# Patient Record
Sex: Male | Born: 1985 | Race: Black or African American | Hispanic: No | Marital: Single | Smoking: Current every day smoker
Health system: Southern US, Community
[De-identification: ages and names within clinical notes are randomized; demographics above are authoritative.]

---

## 2019-06-19 ENCOUNTER — Emergency Department (HOSPITAL_COMMUNITY): Payer: Self-pay

## 2019-06-19 ENCOUNTER — Other Ambulatory Visit: Payer: Self-pay

## 2019-06-19 ENCOUNTER — Encounter (HOSPITAL_COMMUNITY): Payer: Self-pay | Admitting: Emergency Medicine

## 2019-06-19 ENCOUNTER — Emergency Department (HOSPITAL_COMMUNITY)
Admission: EM | Admit: 2019-06-19 | Discharge: 2019-06-19 | Disposition: A | Discharge status: Home / Self Care | Payer: Self-pay | Attending: Emergency Medicine | Admitting: Emergency Medicine

## 2019-06-19 DIAGNOSIS — Y939 Activity, unspecified: Secondary | ICD-10-CM | POA: Insufficient documentation

## 2019-06-19 DIAGNOSIS — X58XXXA Exposure to other specified factors, initial encounter: Secondary | ICD-10-CM | POA: Insufficient documentation

## 2019-06-19 DIAGNOSIS — S838X1A Sprain of other specified parts of right knee, initial encounter: Secondary | ICD-10-CM | POA: Insufficient documentation

## 2019-06-19 DIAGNOSIS — F17210 Nicotine dependence, cigarettes, uncomplicated: Secondary | ICD-10-CM | POA: Insufficient documentation

## 2019-06-19 DIAGNOSIS — Y999 Unspecified external cause status: Secondary | ICD-10-CM | POA: Insufficient documentation

## 2019-06-19 DIAGNOSIS — Y929 Unspecified place or not applicable: Secondary | ICD-10-CM | POA: Insufficient documentation

## 2019-06-19 MED ORDER — DICLOFENAC SODIUM 75 MG PO TBEC: 75.0000 mg | DELAYED_RELEASE_TABLET | Freq: Two times a day (BID) | ORAL | 0 refills | Status: DC

## 2019-06-19 NOTE — ED Notes (Signed)
All appropriate discharge materials reviewed at length with patient. Time for questions provided. Pt has no other questions at this time and verbalizes understanding of all provided materials.  

## 2019-06-19 NOTE — ED Provider Notes (Addendum)
[facility] EMERGENCY DEPARTMENT Provider Note   CSN: [id number] Arrival date & time: 06/19/19  1028     History Chief Complaint  Patient presents with  . Knee Pain    Lenford Cratty is a 34 y.o. male.  The history is provided by the patient. No language interpreter was used.  Knee Pain Location:  Knee Time since incident:  1 day Injury: no   Knee location:  R knee Pain details:    Quality:  Aching   Radiates to:  Does not radiate   Severity:  Moderate   Onset quality:  Gradual   Duration:  1 day   Timing:  Constant Chronicity:  New Foreign body present:  No foreign bodies Prior injury to area:  Yes Relieved by:  Nothing Worsened by:  Nothing Risk factors: no frequent fractures        History reviewed. No pertinent past medical history.  There are no problems to display for this patient.   History reviewed. No pertinent surgical history.     No family history on file.  Social History   Tobacco Use  . Smoking status: Current Every Day Smoker  . Smokeless tobacco: Never Used  Substance Use Topics  . Alcohol use: Not Currently  . Drug use: Not Currently    Home Medications Prior to Admission medications   Not on File    Allergies    Patient has no allergy information on record.  Review of Systems   Review of Systems  Musculoskeletal: Positive for joint swelling and myalgias.  All other systems reviewed and are negative.   Physical Exam Updated Vital Signs BP 115/70   Pulse 75   Temp 98.5 F (36.9 C) (Oral)   Resp 18   Ht 5\' 8"  (1.727 m)   Wt 102.1 kg   SpO2 98%   BMI 34.21 kg/m   Physical Exam Vitals reviewed.  Constitutional:      Appearance: Normal appearance.  HENT:     Head: Normocephalic.  Cardiovascular:     Rate and Rhythm: Normal rate.  Pulmonary:     Effort: Pulmonary effort is normal.  Abdominal:     General: Abdomen is flat.  Musculoskeletal:        General: Swelling and tenderness present.      Comments: Tender medial right knee, pain with movement, nv nad ns intact   Skin:    General: Skin is warm.  Neurological:     General: No focal deficit present.     Mental Status: He is alert.  Psychiatric:        Mood and Affect: Mood normal.    Pt injured his ACL several years ago and did not have repaired  ED Results / Procedures / Treatments   Labs (all labs ordered are listed, but only abnormal results are displayed) Labs Reviewed - No data to display  EKG None  Radiology DG Knee Complete 4 Views Right  Result Date: 06/19/2019 CLINICAL DATA:  Pt with past injuries to right knee (meniscus and ACL tear) felt a pop today when bending down at work - since the pop pt is having severe right knee pain especially just medially and laterally to the patella EXAM: RIGHT KNEE - COMPLETE 4+ VIEW COMPARISON:  None. FINDINGS: No evidence of fracture, dislocation, or joint effusion. No evidence of arthropathy or other focal bone abnormality. Soft tissues are unremarkable. IMPRESSION: Negative. Electronically Signed   By: 06/21/2019 M.D.   On: 06/19/2019  11:15    Procedures Procedures (including critical care time)  Medications Ordered in ED Medications - No data to display  ED Course  I have reviewed the triage vital signs and the nursing notes.  Pertinent labs & imaging results that were available during my care of the patient were reviewed by me and considered in my medical decision making (see chart for details).    MDM Rules/Calculators/A&P                      MDM: xray no fracture, Pt placed in a knee imbolizer and given crutches,  Pt advised to schedule to see Dr. Erlinda Hong for evalaution  Final Clinical Impression(s) / ED Diagnoses Final diagnoses:  Sprain of other ligament of right knee, initial encounter    Rx / DC Orders ED Discharge Orders         Ordered    diclofenac (VOLTAREN) 75 MG EC tablet  2 times daily     06/19/19 1136        An After Visit Summary was  printed and given to the patient.    Fransico Meadow, PA-C 06/19/19 1339    Fransico Meadow, [place] 06/19/19 1341    Valarie Merino, MD 06/22/19 1106

## 2019-06-19 NOTE — Discharge Instructions (Signed)
Schedule to see the Orthopedist for evaluation

## 2019-06-19 NOTE — ED Triage Notes (Signed)
C/o R knee pain since bending down at work today.  Ambulatory to triage.

## 2020-10-28 IMAGING — CR DG KNEE COMPLETE 4+V*R*
4 series · 4 of 4 positions shown · non-contrast
Comparison: None.

CLINICAL DATA: Pt with past injuries to right knee (meniscus and
ACL tear) felt a pop today when bending down at work - since the pop
pt is having severe right knee pain especially just medially and
laterally to the patella

EXAM:
RIGHT KNEE - COMPLETE 4+ VIEW

[knee ap]
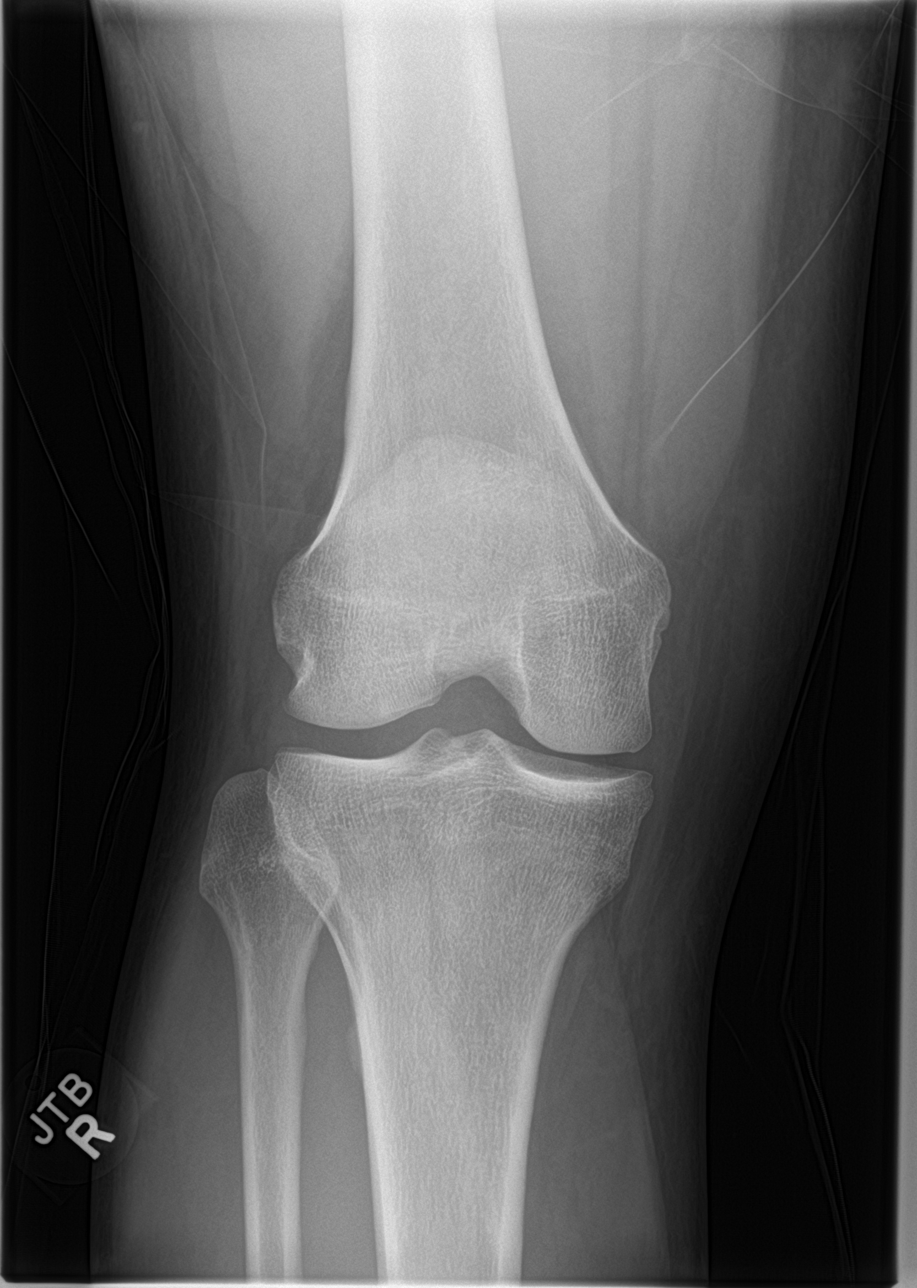

[knee lat]
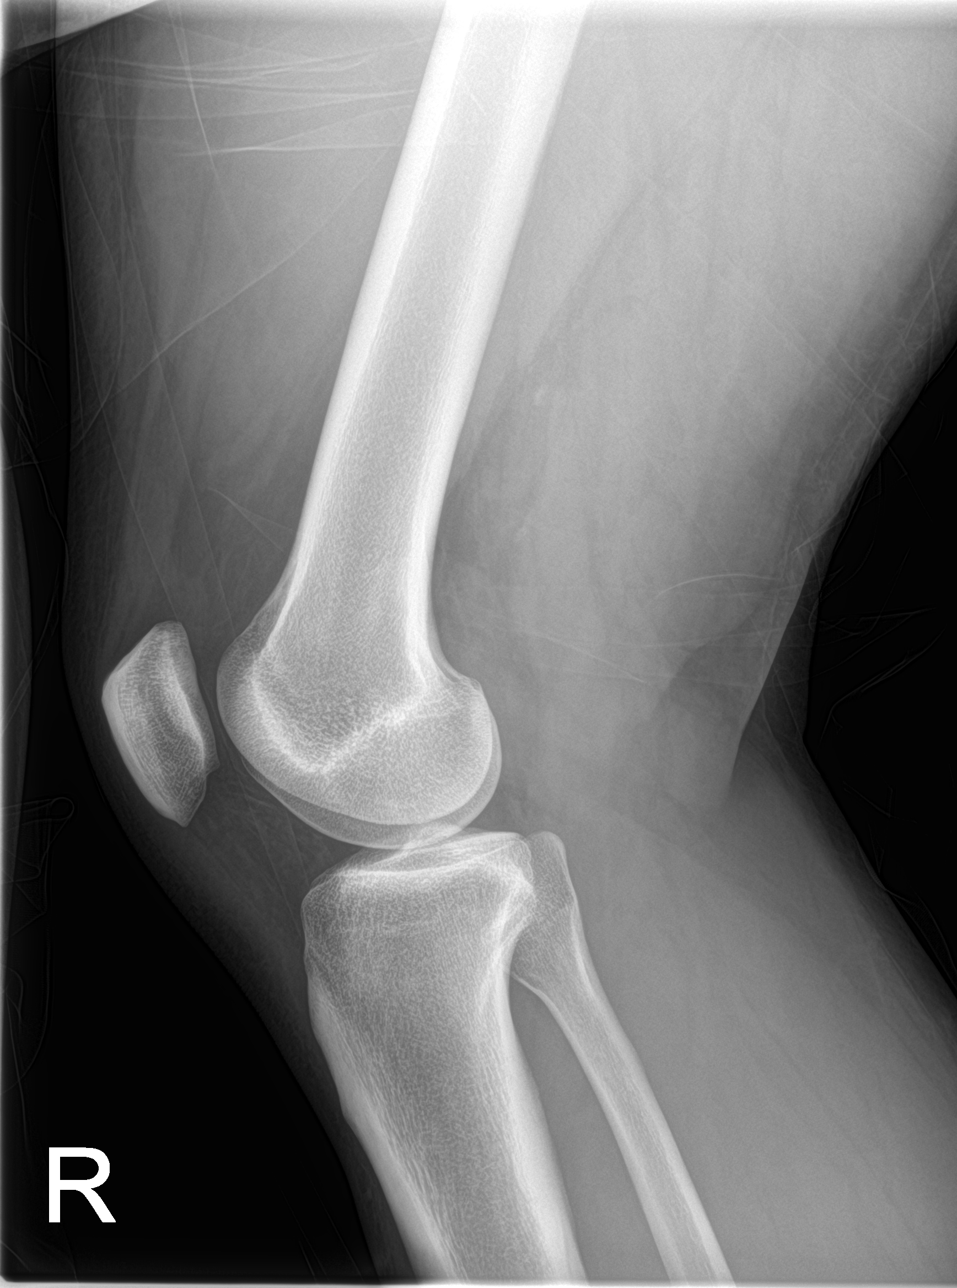

[knee obl (1 of 2)]
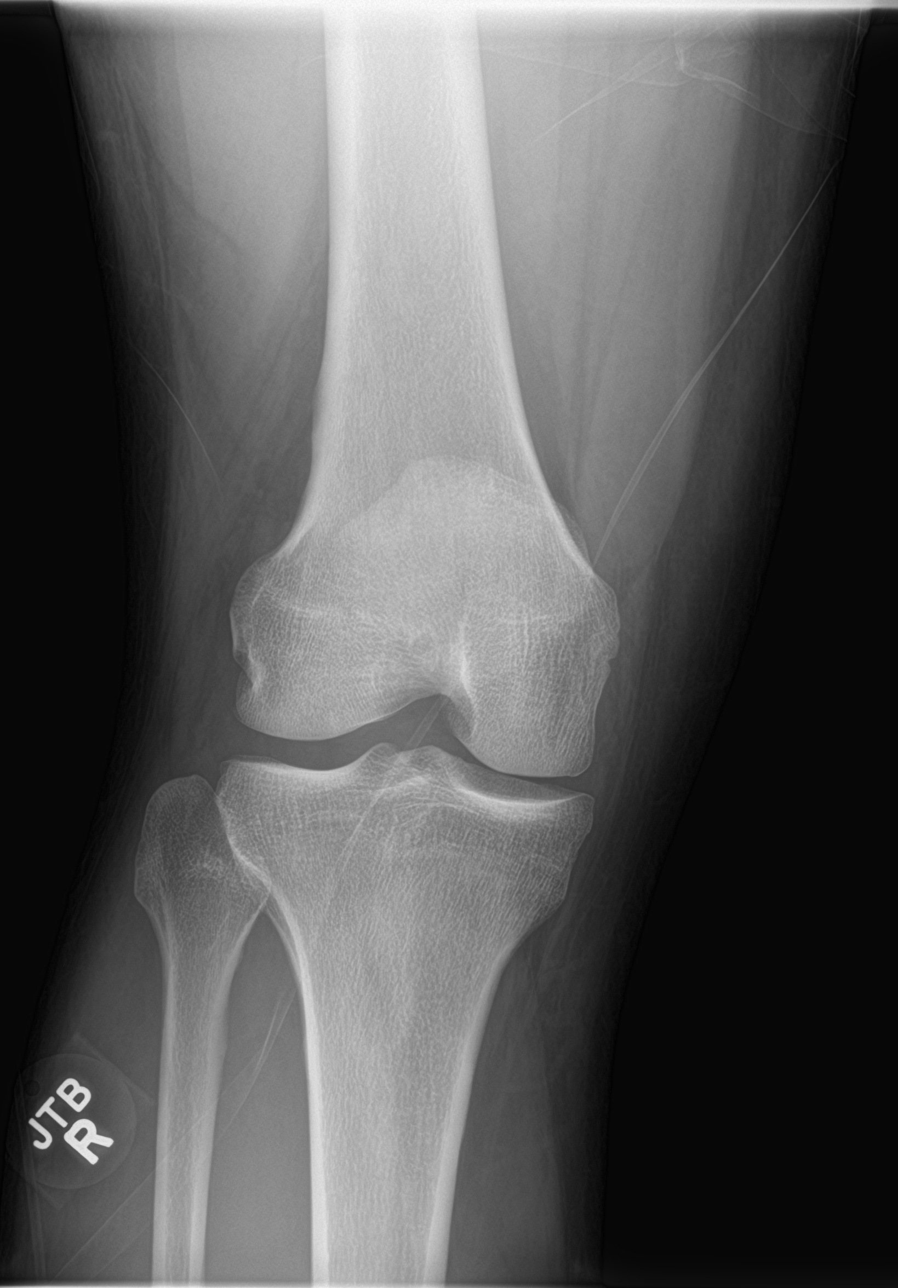

[knee obl (2 of 2)]
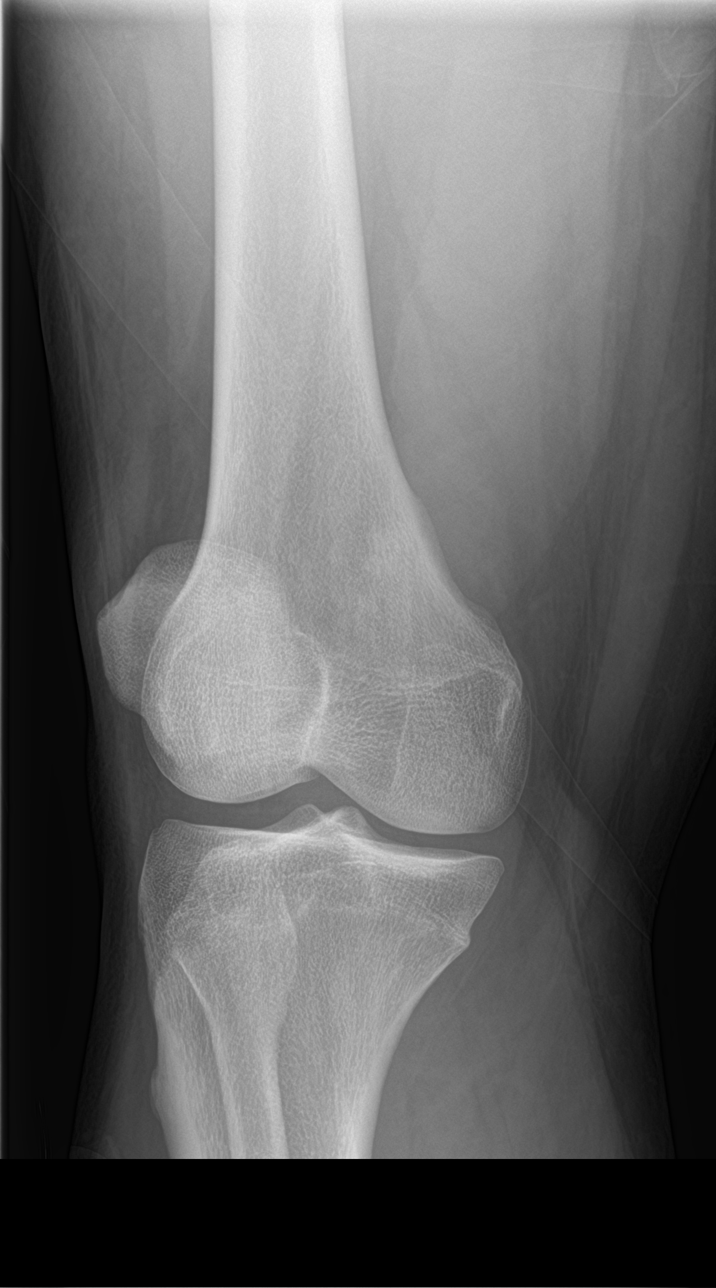

[4 of 4 positions shown; findings below may reference images not displayed]

FINDINGS: No evidence of fracture, dislocation, or joint effusion. No evidence
of arthropathy or other focal bone abnormality. Soft tissues are
unremarkable.
IMPRESSION: Negative.

## 2022-07-13 ENCOUNTER — Encounter (HOSPITAL_COMMUNITY): Payer: Self-pay

## 2022-07-13 ENCOUNTER — Ambulatory Visit (INDEPENDENT_AMBULATORY_CARE_PROVIDER_SITE_OTHER): Payer: Medicaid Other

## 2022-07-13 ENCOUNTER — Ambulatory Visit (HOSPITAL_COMMUNITY)
Admission: EM | Admit: 2022-07-13 | Discharge: 2022-07-13 | Disposition: A | Discharge status: Home / Self Care | Payer: Medicaid Other | Attending: Family Medicine | Admitting: Family Medicine

## 2022-07-13 DIAGNOSIS — M545 Low back pain, unspecified: Secondary | ICD-10-CM

## 2022-07-13 MED ORDER — TIZANIDINE HCL 4 MG PO TABS: 4.0000 mg | ORAL_TABLET | Freq: Three times a day (TID) | ORAL | 0 refills | Status: AC | PRN

## 2022-07-13 MED ORDER — IBUPROFEN 800 MG PO TABS: 800.0000 mg | ORAL_TABLET | Freq: Three times a day (TID) | ORAL | 0 refills | Status: AC | PRN

## 2022-07-13 NOTE — ED Provider Notes (Signed)
[facility]    CSN: [id number] Arrival date & time: 07/13/22  1658      History   Chief Complaint Chief Complaint  Patient presents with   Back Pain    HPI Tony Gomez is a 36 y.o. male.    Back Pain  Here for low back pain for about 3 days.  No trauma or fall noted.  No fever or chills and no rash.  It hurts to bend.  No dysuria or hematuria.  His wife states that sometimes there is a bulge below the area that is hurting. History reviewed. No pertinent past medical history.  There are no problems to display for this patient.   History reviewed. No pertinent surgical history.     Home Medications    Prior to Admission medications   Medication Sig Start Date End Date Taking? Authorizing Provider  ibuprofen (ADVIL) 800 MG tablet Take 1 tablet (800 mg total) by mouth every 8 (eight) hours as needed (pain). 07/13/22  Yes Nikolis Berent, Janace Aris, MD  tiZANidine (ZANAFLEX) 4 MG tablet Take 1 tablet (4 mg total) by mouth every 8 (eight) hours as needed for muscle spasms. 07/13/22  Yes Zenia Resides, MD    Family History History reviewed. No pertinent family history.  Social History Social History   Tobacco Use   Smoking status: Every Day    Packs/day: .1    Types: Cigarettes   Smokeless tobacco: Never  Vaping Use   Vaping Use: Every day   Substances: Flavoring  Substance Use Topics   Alcohol use: Never   Drug use: Never     Allergies   Patient has no known allergies.   Review of Systems Review of Systems  Musculoskeletal:  Positive for back pain.     Physical Exam Triage Vital Signs ED Triage Vitals  Enc Vitals Group     BP 07/13/22 1802 116/80     Pulse Rate 07/13/22 1802 70     Resp 07/13/22 1802 16     Temp 07/13/22 1802 98.2 F (36.8 C)     Temp Source 07/13/22 1802 Oral     SpO2 07/13/22 1802 96 %     Weight --      Height --      Head Circumference --      Peak Flow --      Pain Score 07/13/22 1804 10     Pain  Loc --      Pain Edu? --      Excl. in GC? --    No data found.  Updated Vital Signs BP 116/80 (BP Location: Left Arm)   Pulse 70   Temp 98.2 F (36.8 C) (Oral)   Resp 16   SpO2 96%   Visual Acuity Right Eye Distance:   Left Eye Distance:   Bilateral Distance:    Right Eye Near:   Left Eye Near:    Bilateral Near:     Physical Exam Vitals reviewed.  Constitutional:      General: He is not in acute distress.    Appearance: He is not ill-appearing, toxic-appearing or diaphoretic.  HENT:     Mouth/Throat:     Mouth: Mucous membranes are moist.  Eyes:     Extraocular Movements: Extraocular movements intact.     Conjunctiva/sclera: Conjunctivae normal.     Pupils: Pupils are equal, round, and reactive to light.  Cardiovascular:     Rate and Rhythm: Normal rate and regular rhythm.  Heart sounds: No murmur heard. Pulmonary:     Effort: Pulmonary effort is normal.     Breath sounds: Normal breath sounds.  Musculoskeletal:     Cervical back: Neck supple.     Comments: He is very tender over about L2-L3.  No rash.  No induration.  I do not see any deformity or abnormal swelling.  Lymphadenopathy:     Cervical: No cervical adenopathy.  Skin:    Coloration: Skin is not jaundiced or pale.  Neurological:     General: No focal deficit present.     Mental Status: He is alert and oriented to person, place, and time.      UC Treatments / Results  Labs (all labs ordered are listed, but only abnormal results are displayed) Labs Reviewed - No data to display  EKG   Radiology DG Lumbar Spine Complete  Result Date: 07/13/2022 CLINICAL DATA:  Low back pain EXAM: LUMBAR SPINE - COMPLETE 4+ VIEW COMPARISON:  None Available. FINDINGS: Lumbar alignment is within normal limits. Minimal probable chronic anterior wedging T12 and L1. Mild disc space narrowing at L4-L5 and L5-S1. IMPRESSION: Mild disc space narrowing at L4-L5 and L5-S1. Electronically Signed   By: Jasmine Pang  M.D.   On: 07/13/2022 18:37    Procedures Procedures (including critical care time)  Medications Ordered in UC Medications - No data to display  Initial Impression / Assessment and Plan / UC Course  I have reviewed the triage vital signs and the nursing notes.  Pertinent labs & imaging results that were available during my care of the patient were reviewed by me and considered in my medical decision making (see chart for details).        X-rays did not show any acute bony problem, but he does have some disc space narrowing at L4-L5 and L5-S1.  He declined my offer of a Toradol injection.  Ibuprofen 800 mg and some tizanidine are sent in.  He is given instructions on how to set up primary care on her [facility] health website.   Final Clinical Impressions(s) / UC Diagnoses   Final diagnoses:  Acute midline low back pain without sciatica     Discharge Instructions      Your x-rays do not show any broken bones.  There is possibly some narrowed disc spaces at L4-L5 and L5-S1 spaces.  These are at the very low part of your back just above the small of your back.  Take ibuprofen 800 mg--1 tab every 8 hours as needed for pain.   Take tizanidine 4 mg--1 every 8 hours as needed for muscle spasms; this medication can cause dizziness and sleepiness   You can use the QR code/website at the back of the summary paperwork to schedule yourself a new patient appointment with primary care        ED Prescriptions     Medication Sig Dispense Auth. Provider   ibuprofen (ADVIL) 800 MG tablet Take 1 tablet (800 mg total) by mouth every 8 (eight) hours as needed (pain). 21 tablet [employer] Logsdon, Janace Aris, MD   tiZANidine (ZANAFLEX) 4 MG tablet Take 1 tablet (4 mg total) by mouth every 8 (eight) hours as needed for muscle spasms. 15 tablet Corianne Buccellato, Janace Aris, MD      PDMP not reviewed this encounter.   Zenia Resides, MD 07/13/22 1900

## 2022-07-13 NOTE — Discharge Instructions (Addendum)
Your x-rays do not show any broken bones.  There is possibly some narrowed disc spaces at L4-L5 and L5-S1 spaces.  These are at the very low part of your back just above the small of your back.  Take ibuprofen 800 mg--1 tab every 8 hours as needed for pain.   Take tizanidine 4 mg--1 every 8 hours as needed for muscle spasms; this medication can cause dizziness and sleepiness   You can use the QR code/website at the back of the summary paperwork to schedule yourself a new patient appointment with primary care

## 2022-07-13 NOTE — ED Triage Notes (Signed)
Patient c/o mid lower back pain x 4 days. Patient states he has intermittent pain that radiates down the right leg.  Patient states he has not taken any pain medication for his pain.

## 2022-09-29 ENCOUNTER — Other Ambulatory Visit: Payer: Self-pay

## 2022-09-29 ENCOUNTER — Encounter (HOSPITAL_COMMUNITY): Payer: Self-pay

## 2022-09-29 ENCOUNTER — Emergency Department (HOSPITAL_COMMUNITY)
Admission: EM | Admit: 2022-09-29 | Discharge: 2022-09-29 | Disposition: A | Discharge status: Home / Self Care | Payer: Medicaid Other | Attending: Emergency Medicine | Admitting: Emergency Medicine

## 2022-09-29 ENCOUNTER — Emergency Department (HOSPITAL_COMMUNITY): Payer: Medicaid Other

## 2022-09-29 DIAGNOSIS — M545 Low back pain, unspecified: Secondary | ICD-10-CM | POA: Diagnosis present

## 2022-09-29 DIAGNOSIS — F17210 Nicotine dependence, cigarettes, uncomplicated: Secondary | ICD-10-CM | POA: Insufficient documentation

## 2022-09-29 MED ORDER — METHOCARBAMOL 500 MG PO TABS: 500.0000 mg | ORAL_TABLET | Freq: Three times a day (TID) | ORAL | 0 refills | Status: AC | PRN

## 2022-09-29 MED ORDER — NAPROXEN 500 MG PO TABS: 500.0000 mg | ORAL_TABLET | Freq: Two times a day (BID) | ORAL | 0 refills | Status: AC

## 2022-09-29 MED ORDER — METHOCARBAMOL 500 MG PO TABS
1000.0000 mg | ORAL_TABLET | Freq: Once | ORAL | Status: AC
  Administered 2022-09-29: 1000 mg via ORAL
  Filled 2022-09-29: qty 2

## 2022-09-29 MED ORDER — NAPROXEN 250 MG PO TABS
500.0000 mg | ORAL_TABLET | Freq: Once | ORAL | Status: AC
  Administered 2022-09-29: 500 mg via ORAL
  Filled 2022-09-29: qty 2

## 2022-09-29 MED ORDER — LIDOCAINE 5 % EX PTCH: 1.0000 | MEDICATED_PATCH | CUTANEOUS | 0 refills | Status: AC

## 2022-09-29 NOTE — ED Provider Notes (Signed)
[facility] Emergency Department Provider Note MRN:  [id number]  Arrival date & time: 09/29/22     Chief Complaint   Back Pain   History of Present Illness   Tony Gomez is a 37 y.o. year-old male with no pertinent past medical history presenting to the ED with chief complaint of back pain.  Back pain on and off for a year, worse over the past week.  Lower back pain with radiation down the back of the right leg.  Worse with movement.  No numbness or weakness to the arms or legs, no bowel or bladder dysfunction, no fever.  Review of Systems  A thorough review of systems was obtained and all systems are negative except as noted in the HPI and PMH.   Patient's Health History   History reviewed. No pertinent past medical history.  History reviewed. No pertinent surgical history.  History reviewed. No pertinent family history.  Social History   Socioeconomic History   Marital status: Single    Spouse name: Not on file   Number of children: Not on file   Years of education: Not on file   Highest education level: Not on file  Occupational History   Not on file  Tobacco Use   Smoking status: Every Day    Packs/day: .1    Types: Cigarettes   Smokeless tobacco: Never  Vaping Use   Vaping Use: Every day   Substances: Flavoring  Substance and Sexual Activity   Alcohol use: Never   Drug use: Never   Sexual activity: Not on file  Other Topics Concern   Not on file  Social History Narrative   Not on file   Social Determinants of Health   Financial Resource Strain: Not on file  Food Insecurity: Not on file  Transportation Needs: Not on file  Physical Activity: Not on file  Stress: Not on file  Social Connections: Not on file  Intimate Partner Violence: Not on file     Physical Exam   Vitals:   09/29/22 0500 09/29/22 0620  BP: 113/81   Pulse: 77   Resp:    Temp: 97.8 F (36.6 C) 97.8 F (36.6 C)  SpO2: 99%     CONSTITUTIONAL:  Well-appearing, NAD NEURO/PSYCH:  Alert and oriented x 3, no focal deficits EYES:  eyes equal and reactive ENT/NECK:  no LAD, no JVD CARDIO: Regular rate, well-perfused, normal S1 and S2 PULM:  CTAB no wheezing or rhonchi GI/GU:  non-distended, non-tender MSK/SPINE:  No gross deformities, no edema SKIN:  no rash, atraumatic   *Additional and/or pertinent findings included in MDM below  Diagnostic and Interventional Summary    EKG Interpretation  Date/Time:    Ventricular Rate:    PR Interval:    QRS Duration:   QT Interval:    QTC Calculation:   R Axis:     Text Interpretation:         Labs Reviewed - No data to display  DG Lumbar Spine Complete    (Results Pending)    Medications  naproxen (NAPROSYN) tablet 500 mg (500 mg Oral Given 09/29/22 0602)  methocarbamol (ROBAXIN) tablet 1,000 mg (1,000 mg Oral Given 09/29/22 0602)     Procedures  /  Critical Care Procedures  ED Course and Medical Decision Making  Initial Impression and Ddx Suspect muscle strain or spasm or sciatica.  No signs or symptoms of myelopathy.  Reassuring neurological exam and vital signs.  Given the duration of pain will  obtain screening x-ray.  Past medical/surgical history that increases complexity of ED encounter: None  Interpretation of Diagnostics I personally reviewed the lumbar x-ray and my interpretation is as follows: No fracture or bony abnormality    Patient Reassessment and Ultimate Disposition/Management     Discharge  Patient management required discussion with the following services or consulting groups:  None  Complexity of Problems Addressed Acute complicated illness or Injury  Additional Data Reviewed and Analyzed Further history obtained from: None  Additional Factors Impacting ED Encounter Risk Prescriptions  Elmer Sow. Pilar Plate, MD [facility] Emergency Medicine [facility] [email or web address]  Final Clinical Impressions(s) / ED Diagnoses      ICD-10-CM   1. Low back pain, unspecified back pain laterality, unspecified chronicity, unspecified whether sciatica present  M54.50       ED Discharge Orders          Ordered    naproxen (NAPROSYN) 500 MG tablet  2 times daily        09/29/22 0628    methocarbamol (ROBAXIN) 500 MG tablet  Every 8 hours PRN        09/29/22 0628    lidocaine (LIDODERM) 5 %  Every 24 hours        09/29/22 5621             Discharge Instructions Discussed with and Provided to Patient:     Discharge Instructions      You were evaluated in the Emergency Department and after careful evaluation, we did not find any emergent condition requiring admission or further testing in the hospital.  Your exam/testing today is overall reassuring.  Recommend using the Naprosyn twice daily as prescribed for pain.  Can use the Robaxin muscle relaxer for more significant pain, best used at night if you are having trouble sleeping as it can cause drowsiness.  Can also use the numbing patches during the day.  Please return to the Emergency Department if you experience any worsening of your condition.   Thank you for allowing [place] to be a part of your care.       Sabas Sous, MD 09/29/22 [phone]

## 2022-09-29 NOTE — Discharge Instructions (Addendum)
You were evaluated in the Emergency Department and after careful evaluation, we did not find any emergent condition requiring admission or further testing in the hospital.  Your exam/testing today is overall reassuring.  Recommend using the Naprosyn twice daily as prescribed for pain.  Can use the Robaxin muscle relaxer for more significant pain, best used at night if you are having trouble sleeping as it can cause drowsiness.  Can also use the numbing patches during the day.  Please return to the Emergency Department if you experience any worsening of your condition.   Thank you for allowing [place] to be a part of your care.

## 2022-09-29 NOTE — ED Triage Notes (Signed)
Pt seen recently for back pain at [facility] - was told has problems with L4 and L5.  Today pain is worse and in mid back also.  Pain has been progressively worse for last 1.5 weeks

## 2022-11-04 ENCOUNTER — Emergency Department (HOSPITAL_COMMUNITY): Payer: Medicaid Other

## 2022-11-04 ENCOUNTER — Encounter (HOSPITAL_COMMUNITY): Payer: Self-pay | Admitting: Emergency Medicine

## 2022-11-04 ENCOUNTER — Emergency Department (HOSPITAL_COMMUNITY)
Admission: EM | Admit: 2022-11-04 | Discharge: 2022-11-05 | Disposition: A | Discharge status: Home / Self Care | Payer: Medicaid Other | Attending: Emergency Medicine | Admitting: Emergency Medicine

## 2022-11-04 ENCOUNTER — Other Ambulatory Visit: Payer: Self-pay

## 2022-11-04 DIAGNOSIS — R569 Unspecified convulsions: Secondary | ICD-10-CM | POA: Insufficient documentation

## 2022-11-04 DIAGNOSIS — R519 Headache, unspecified: Secondary | ICD-10-CM | POA: Diagnosis not present

## 2022-11-04 LAB — CBC
HCT: 45.7 % (ref 39.0–52.0)
Hemoglobin: 14.8 g/dL (ref 13.0–17.0)
MCH: 27.4 pg (ref 26.0–34.0)
MCHC: 32.4 g/dL (ref 30.0–36.0)
MCV: 84.5 fL (ref 80.0–100.0)
Platelets: 244 10*3/uL (ref 150–400)
RBC: 5.41 MIL/uL (ref 4.22–5.81)
RDW: 13.6 % (ref 11.5–15.5)
WBC: 5 10*3/uL (ref 4.0–10.5)
nRBC: 0 % (ref 0.0–0.2)

## 2022-11-04 LAB — BASIC METABOLIC PANEL
Anion gap: 8 (ref 5–15)
BUN: 8 mg/dL (ref 6–20)
CO2: 24 mmol/L (ref 22–32)
Calcium: 8.8 mg/dL — ABNORMAL LOW (ref 8.9–10.3)
Chloride: 104 mmol/L (ref 98–111)
Creatinine, Ser: 1.38 mg/dL — ABNORMAL HIGH (ref 0.61–1.24)
GFR, Estimated: >60 mL/min (ref >60)
Glucose, Bld: 99 mg/dL (ref 70–99)
Potassium: 3.8 mmol/L (ref 3.5–5.1)
Sodium: 136 mmol/L (ref 135–145)

## 2022-11-04 MED ORDER — DEXAMETHASONE SODIUM PHOSPHATE 10 MG/ML IJ SOLN
10.0000 mg | Freq: Once | INTRAMUSCULAR | Status: AC
  Administered 2022-11-05: 10 mg via INTRAMUSCULAR
  Filled 2022-11-04: qty 1

## 2022-11-04 MED ORDER — KETOROLAC TROMETHAMINE 15 MG/ML IJ SOLN
15.0000 mg | Freq: Once | INTRAMUSCULAR | Status: AC
  Administered 2022-11-05: 15 mg via INTRAMUSCULAR
  Filled 2022-11-04: qty 1

## 2022-11-04 NOTE — ED Provider Notes (Incomplete)
EMERGENCY DEPARTMENT AT [facility] Provider Note   CSN: [id number] Arrival date & time: 11/04/22  2138     History {Add pertinent medical, surgical, social history, OB history to HPI:1} Chief Complaint  Patient presents with  . Possible Seizure    Tony Gomez is a 37 y.o. male.  Patient presents to the emergency department complaining of possible seizure that occurred earlier this evening.  Patient was reportedly playing a video game when his significant other noticed that he was staring off into space, drooling, and had upper extremity tremors that lasted for less than 1 to 2 minutes.  There was no reported postictal type.  No confusion.  The patient does not remember the actual incident.  He does endorse a headache at this time, left-sided in nature.  He denies urinary incontinence, injury to tongue or cheek.  The patient denies any personal history of seizures but does state that his mother has a seizure history.  Patient denies any alcohol or drug usage.  HPI     Home Medications Prior to Admission medications   Medication Sig Start Date End Date Taking? Authorizing Provider  ibuprofen (ADVIL) 800 MG tablet Take 1 tablet (800 mg total) by mouth every 8 (eight) hours as needed (pain). 07/13/22   Zenia Resides, MD  lidocaine (LIDODERM) 5 % Place 1 patch onto the skin daily. Remove & Discard patch within 12 hours or as directed by MD 09/29/22   Sabas Sous, MD  methocarbamol (ROBAXIN) 500 MG tablet Take 1 tablet (500 mg total) by mouth every 8 (eight) hours as needed for muscle spasms. 09/29/22   Sabas Sous, MD  naproxen (NAPROSYN) 500 MG tablet Take 1 tablet (500 mg total) by mouth 2 (two) times daily. 09/29/22   Sabas Sous, MD  tiZANidine (ZANAFLEX) 4 MG tablet Take 1 tablet (4 mg total) by mouth every 8 (eight) hours as needed for muscle spasms. 07/13/22   Zenia Resides, MD      Allergies    Patient has no known allergies.    Review  of Systems   Review of Systems  Physical Exam Updated Vital Signs BP 119/81 (BP Location: Right Arm)   Pulse 84   Temp 98.4 F (36.9 C) (Oral)   Resp 16   SpO2 99%  Physical Exam Vitals and nursing note reviewed.  Constitutional:      General: He is not in acute distress.    Appearance: He is well-developed.  HENT:     Head: Normocephalic and atraumatic.  Eyes:     Conjunctiva/sclera: Conjunctivae normal.  Cardiovascular:     Rate and Rhythm: Normal rate and regular rhythm.     Heart sounds: No murmur heard. Pulmonary:     Effort: Pulmonary effort is normal. No respiratory distress.     Breath sounds: Normal breath sounds.  Abdominal:     Palpations: Abdomen is soft.     Tenderness: There is no abdominal tenderness.  Musculoskeletal:        General: No swelling.     Cervical back: Neck supple.  Skin:    General: Skin is warm and dry.     Capillary Refill: Capillary refill takes less than 2 seconds.  Neurological:     General: No focal deficit present.     Mental Status: He is alert and oriented to person, place, and time.     Sensory: No sensory deficit.     Motor: No weakness.  Comments: Cranial nerves II through VII, XI, XII intact  Psychiatric:        Mood and Affect: Mood normal.     ED Results / Procedures / Treatments   Labs (all labs ordered are listed, but only abnormal results are displayed) Labs Reviewed  BASIC METABOLIC PANEL - Abnormal; Notable for the following components:      Result Value   Creatinine, Ser 1.38 (*)    Calcium 8.8 (*)    All other components within normal limits  CBC    EKG None  Radiology No results found.  Procedures Procedures  {Document cardiac monitor, telemetry assessment procedure when appropriate:1}  Medications Ordered in ED Medications - No data to display  ED Course/ Medical Decision Making/ A&P   {   Click here for ABCD2, HEART and other calculatorsREFRESH Note before signing :1}                               Medical Decision Making Amount and/or Complexity of Data Reviewed Labs: ordered. Radiology: ordered.   This patient presents to the ED for concern of possible seizure, this involves an extensive number of treatment options, and is a complaint that carries with it a high risk of complications and morbidity.  The differential diagnosis includes seizure, syncopal episode, others   Co morbidities that complicate the patient evaluation  None   Additional history obtained:  Additional history obtained from visitor at bedside   Lab Tests:  I Ordered, and personally interpreted labs.  The pertinent results include: Creatinine 1.38, unremarkable CBC   Imaging Studies ordered:  I ordered imaging studies including CT head without contrast I independently visualized and interpreted imaging which showed  1. No acute intracranial abnormality.  2. 10 mm enhancing nodule within the left parotid gland,  incompletely included on this examination, possibly representing a  salivary neoplasm or intraglandular lymph node. This could be better  evaluated with nonemergent MRI examination with and without  contrast.  3. Rim calcified solid structure within the left maxillary sinus may  represent an antrochoanal polyp but is not well characterized on  this examination. This could also be better evaluated with  nonemergent MRI examination with and without contrast.   I agree with the radiologist interpretation   Cardiac Monitoring: / EKG:  The patient was maintained on a cardiac monitor.  I personally viewed and interpreted the cardiac monitored which showed an underlying rhythm of: ***    Problem List / ED Course / Critical interventions / Medication management   I ordered medication including Toradol and Decadron for headache Reevaluation of the patient after these medicines showed that the patient {resolved/improved/worsened:23923::"improved"} I have reviewed the patients  home medicines and have made adjustments as needed   Social Determinants of Health:  Patient has Medicaid for his primary health insurance type and currently has no primary care provider   Test / Admission - Considered:  There is no indication at this time for admission.  Patient with episode concerning for possible first-time seizure.  Head CT with incidental findings but no acute intracranial abnormalities.  Patient has been instructed to follow-up with primary care for further evaluation and likely nonemergent MRI.  Plan to place referral with neurology for evaluation of patient's seizure like episode.  Labs grossly unremarkable.  Discharge home at this time.   {Document critical care time when appropriate:1} {Document review of labs and clinical decision tools ie heart  score, Chads2Vasc2 etc:1}  {Document your independent review of radiology images, and any outside records:1} {Document your discussion with family members, caretakers, and with consultants:1} {Document social determinants of health affecting pt's care:1} {Document your decision making why or why not admission, treatments were needed:1} Final Clinical Impression(s) / ED Diagnoses Final diagnoses:  None    Rx / DC Orders ED Discharge Orders     None

## 2022-11-04 NOTE — ED Provider Notes (Signed)
[place] EMERGENCY DEPARTMENT AT [facility] Provider Note   CSN: [id number] Arrival date & time: 11/04/22  2138     History  Chief Complaint  Patient presents with   Possible Seizure    Ether Scheffel is a 37 y.o. male.  Patient presents to the emergency department complaining of possible seizure that occurred earlier this evening.  Patient was reportedly playing a video game when his significant other noticed that he was staring off into space, drooling, and had upper extremity tremors that lasted for less than 1 to 2 minutes.  There was no reported postictal type.  No confusion.  The patient does not remember the actual incident.  He does endorse a headache at this time, left-sided in nature.  He denies urinary incontinence, injury to tongue or cheek.  The patient denies any personal history of seizures but does state that his mother has a seizure history.  Patient denies any alcohol or drug usage.  HPI     Home Medications Prior to Admission medications   Medication Sig Start Date End Date Taking? Authorizing Provider  ibuprofen (ADVIL) 800 MG tablet Take 1 tablet (800 mg total) by mouth every 8 (eight) hours as needed (pain). 07/13/22   Zenia Resides, MD  lidocaine (LIDODERM) 5 % Place 1 patch onto the skin daily. Remove & Discard patch within 12 hours or as directed by MD 09/29/22   Sabas Sous, MD  methocarbamol (ROBAXIN) 500 MG tablet Take 1 tablet (500 mg total) by mouth every 8 (eight) hours as needed for muscle spasms. 09/29/22   Sabas Sous, MD  naproxen (NAPROSYN) 500 MG tablet Take 1 tablet (500 mg total) by mouth 2 (two) times daily. 09/29/22   Sabas Sous, MD  tiZANidine (ZANAFLEX) 4 MG tablet Take 1 tablet (4 mg total) by mouth every 8 (eight) hours as needed for muscle spasms. 07/13/22   Zenia Resides, MD      Allergies    Patient has no known allergies.    Review of Systems   Review of Systems  Physical Exam Updated Vital  Signs BP 119/81 (BP Location: Right Arm)   Pulse 84   Temp 98.4 F (36.9 C) (Oral)   Resp 16   SpO2 99%  Physical Exam Vitals and nursing note reviewed.  Constitutional:      General: He is not in acute distress.    Appearance: He is well-developed.  HENT:     Head: Normocephalic and atraumatic.  Eyes:     Conjunctiva/sclera: Conjunctivae normal.  Cardiovascular:     Rate and Rhythm: Normal rate and regular rhythm.     Heart sounds: No murmur heard. Pulmonary:     Effort: Pulmonary effort is normal. No respiratory distress.     Breath sounds: Normal breath sounds.  Abdominal:     Palpations: Abdomen is soft.     Tenderness: There is no abdominal tenderness.  Musculoskeletal:        General: No swelling.     Cervical back: Neck supple.  Skin:    General: Skin is warm and dry.     Capillary Refill: Capillary refill takes less than 2 seconds.  Neurological:     General: No focal deficit present.     Mental Status: He is alert and oriented to person, place, and time.     Sensory: No sensory deficit.     Motor: No weakness.     Comments: Cranial nerves II through VII,  XI, XII intact  Psychiatric:        Mood and Affect: Mood normal.     ED Results / Procedures / Treatments   Labs (all labs ordered are listed, but only abnormal results are displayed) Labs Reviewed  BASIC METABOLIC PANEL - Abnormal; Notable for the following components:      Result Value   Creatinine, Ser 1.38 (*)    Calcium 8.8 (*)    All other components within normal limits  CBC    EKG None  Radiology CT Head Wo Contrast  Result Date: 11/04/2022 CLINICAL DATA:  Seizure, new-onset, no history of trauma EXAM: CT HEAD WITHOUT CONTRAST TECHNIQUE: Contiguous axial images were obtained from the base of the skull through the vertex without intravenous contrast. RADIATION DOSE REDUCTION: This exam was performed according to the departmental dose-optimization program which includes automated exposure  control, adjustment of the mA and/or kV according to patient size and/or use of iterative reconstruction technique. COMPARISON:  None Available. FINDINGS: Brain: No evidence of acute infarction, hemorrhage, hydrocephalus, extra-axial collection or mass lesion/mass effect. Vascular: No hyperdense vessel or unexpected calcification. Skull: Normal. Negative for fracture or focal lesion. Sinuses/Orbits: Rim calcified solid structure within the left maxillary sinus may represent an antrochoanal polyp but is not well characterized on this examination. Remaining paranasal sinuses are clear. Orbits are unremarkable. Other: 10 mm enhancing nodule noted within the left parotid gland, incompletely included on this examination, possibly representing a salivary neoplasm or intraglandular lymph node. The mastoid air cells and middle ear cavities are clear. IMPRESSION: 1. No acute intracranial abnormality. 2. 10 mm enhancing nodule within the left parotid gland, incompletely included on this examination, possibly representing a salivary neoplasm or intraglandular lymph node. This could be better evaluated with nonemergent MRI examination with and without contrast. 3. Rim calcified solid structure within the left maxillary sinus may represent an antrochoanal polyp but is not well characterized on this examination. This could also be better evaluated with nonemergent MRI examination with and without contrast. Electronically Signed   By: Helyn Numbers M.D.   On: 11/04/2022 23:45    Procedures Procedures    Medications Ordered in ED Medications  ketorolac (TORADOL) 15 MG/ML injection 15 mg (15 mg Intramuscular Given 11/05/22 0013)  dexamethasone (DECADRON) injection 10 mg (10 mg Intramuscular Given 11/05/22 0013)    ED Course/ Medical Decision Making/ A&P                                 Medical Decision Making Amount and/or Complexity of Data Reviewed Labs: ordered. Radiology: ordered.  Risk Prescription drug  management.   This patient presents to the ED for concern of possible seizure, this involves an extensive number of treatment options, and is a complaint that carries with it a high risk of complications and morbidity.  The differential diagnosis includes seizure, syncopal episode, others   Co morbidities that complicate the patient evaluation  None   Additional history obtained:  Additional history obtained from visitor at bedside   Lab Tests:  I Ordered, and personally interpreted labs.  The pertinent results include: Creatinine 1.38, unremarkable CBC   Imaging Studies ordered:  I ordered imaging studies including CT head without contrast I independently visualized and interpreted imaging which showed  1. No acute intracranial abnormality.  2. 10 mm enhancing nodule within the left parotid gland,  incompletely included on this examination, possibly representing a  salivary neoplasm  or intraglandular lymph node. This could be better  evaluated with nonemergent MRI examination with and without  contrast.  3. Rim calcified solid structure within the left maxillary sinus may  represent an antrochoanal polyp but is not well characterized on  this examination. This could also be better evaluated with  nonemergent MRI examination with and without contrast.   I agree with the radiologist interpretation   Cardiac Monitoring: / EKG:  I ordered an EKG but the patient refused.    Problem List / ED Course / Critical interventions / Medication management   I ordered medication including Toradol and Decadron for headache Reevaluation of the patient after these medicines showed that the patient improved I have reviewed the patients home medicines and have made adjustments as needed   Social Determinants of Health:  Patient has Medicaid for his primary health insurance type and currently has no primary care provider   Test / Admission - Considered:  There is no indication  at this time for admission.  Patient with episode concerning for possible first-time seizure.  Head CT with incidental findings but no acute intracranial abnormalities.  Patient has been instructed to follow-up with primary care for further evaluation and likely nonemergent MRI.  EKG was ordered to evaluate for dysrhythmia that could have contributed to patient's symptoms but patient declined.  Based on history I do doubt cardiac etiology.  Plan to place referral with neurology for evaluation of patient's seizure like episode.  Labs grossly unremarkable.  Discharge home at this time.         Final Clinical Impression(s) / ED Diagnoses Final diagnoses:  Seizure-like activity (HCC)  Acute nonintractable headache, unspecified headache type    Rx / DC Orders ED Discharge Orders     None         Pamala Duffel 11/05/22 0140    Palumbo, April, MD 11/05/22 [phone]

## 2022-11-04 NOTE — ED Triage Notes (Signed)
Pt has no hx of seizures.  States PTA he had an episode of "staring into space, small amount of drooling"  SO states she had to yell at him to get him to come around.  Pt states he has a terrible headache.

## 2022-11-05 NOTE — ED Notes (Signed)
This tech went over to get EKG on pt, pt refused. Family member try to convince pt to let this tech do his EKG, pt looked at this tech and stated "No, it's ok, I don't want it." Family member tried again talking to pt to get his EKG, pt then looked at family member and stated "stop for you make me start cursing, I said no, I feel better now". This tech informed pt, I will let his nurse know he don't want it and pt stated "ok thank you". Nurse notified.

## 2022-11-05 NOTE — Discharge Instructions (Addendum)
You were evaluated tonight for a possible seizure.  Your workup was reassuring.  There were some nonemergent findings on your CT scan which need to be evaluated as an outpatient with nonemergent MRI.  I have copied the impression from the CT scan below.  You should follow-up with a primary care provider for further evaluation of these nonemergent findings.  I have also placed a referral with neurology for further evaluation of your seizure-like activity.  Please note that you should refrain from driving until seizure-free for 6 months or cleared by neurology.  CT head without contrast impression: 1. No acute intracranial abnormality.  2. 10 mm enhancing nodule within the left parotid gland,  incompletely included on this examination, possibly representing a  salivary neoplasm or intraglandular lymph node. This could be better  evaluated with nonemergent MRI examination with and without  contrast.  3. Rim calcified solid structure within the left maxillary sinus may  represent an antrochoanal polyp but is not well characterized on  this examination. This could also be better evaluated with  nonemergent MRI examination with and without contrast.

## 2022-12-20 ENCOUNTER — Ambulatory Visit: Payer: Medicaid Other | Admitting: Physician Assistant

## 2023-02-01 ENCOUNTER — Ambulatory Visit: Payer: Medicaid Other | Admitting: Physician Assistant
